# Patient Record
Sex: Male | Born: 1960 | Race: White | Hispanic: No | Marital: Single | Smoking: Current every day smoker
Health system: Southern US, Community
[De-identification: ages and names within clinical notes are randomized; demographics above are authoritative.]

## PROBLEM LIST (undated history)

## (undated) DIAGNOSIS — F209 Schizophrenia, unspecified: Secondary | ICD-10-CM

## (undated) DIAGNOSIS — I452 Bifascicular block: Secondary | ICD-10-CM

## (undated) DIAGNOSIS — I2510 Atherosclerotic heart disease of native coronary artery without angina pectoris: Secondary | ICD-10-CM

## (undated) HISTORY — PX: OTHER SURGICAL HISTORY: SHX169

## (undated) HISTORY — DX: Bifascicular block: I45.2

## (undated) HISTORY — DX: Schizophrenia, unspecified: F20.9

## (undated) HISTORY — DX: Atherosclerotic heart disease of native coronary artery without angina pectoris: I25.10

---

## 2009-06-12 HISTORY — PX: CORONARY ARTERY BYPASS GRAFT: SHX141

## 2009-06-15 ENCOUNTER — Encounter: Payer: Self-pay | Admitting: Cardiology

## 2009-06-16 ENCOUNTER — Ambulatory Visit: Payer: Self-pay | Admitting: Cardiology

## 2009-06-17 ENCOUNTER — Inpatient Hospital Stay (HOSPITAL_COMMUNITY): Admission: EM | Admit: 2009-06-17 | Discharge: 2009-06-26 | Payer: Self-pay | Admitting: Internal Medicine

## 2009-06-17 ENCOUNTER — Ambulatory Visit: Payer: Self-pay | Admitting: Cardiovascular Disease

## 2009-06-17 ENCOUNTER — Encounter: Payer: Self-pay | Admitting: Cardiology

## 2009-06-18 ENCOUNTER — Encounter: Payer: Self-pay | Admitting: Cardiothoracic Surgery

## 2009-06-18 ENCOUNTER — Ambulatory Visit: Payer: Self-pay | Admitting: Cardiothoracic Surgery

## 2009-06-22 ENCOUNTER — Encounter: Payer: Self-pay | Admitting: Cardiothoracic Surgery

## 2009-06-23 ENCOUNTER — Encounter: Payer: Self-pay | Admitting: Cardiothoracic Surgery

## 2009-06-25 ENCOUNTER — Encounter: Payer: Self-pay | Admitting: Cardiothoracic Surgery

## 2009-07-20 ENCOUNTER — Encounter: Admission: RE | Admit: 2009-07-20 | Discharge: 2009-07-20 | Payer: Self-pay | Admitting: Cardiothoracic Surgery

## 2009-07-20 ENCOUNTER — Ambulatory Visit: Payer: Self-pay | Admitting: Cardiothoracic Surgery

## 2009-07-28 ENCOUNTER — Encounter: Payer: Self-pay | Admitting: Cardiology

## 2009-07-29 ENCOUNTER — Ambulatory Visit: Payer: Self-pay | Admitting: Physician Assistant

## 2009-07-29 DIAGNOSIS — I2510 Atherosclerotic heart disease of native coronary artery without angina pectoris: Secondary | ICD-10-CM

## 2009-07-29 DIAGNOSIS — F17200 Nicotine dependence, unspecified, uncomplicated: Secondary | ICD-10-CM | POA: Insufficient documentation

## 2009-07-29 DIAGNOSIS — E782 Mixed hyperlipidemia: Secondary | ICD-10-CM | POA: Insufficient documentation

## 2009-09-23 ENCOUNTER — Encounter (INDEPENDENT_AMBULATORY_CARE_PROVIDER_SITE_OTHER): Payer: Self-pay | Admitting: *Deleted

## 2009-10-30 ENCOUNTER — Encounter (INDEPENDENT_AMBULATORY_CARE_PROVIDER_SITE_OTHER): Payer: Self-pay | Admitting: *Deleted

## 2010-03-02 ENCOUNTER — Ambulatory Visit: Payer: Self-pay | Admitting: Cardiology

## 2010-03-02 ENCOUNTER — Encounter: Payer: Self-pay | Admitting: Cardiology

## 2010-04-13 NOTE — Letter (Signed)
Summary: Generic Engineer, agricultural at [facility] [address]   [place], [place] [address]   Phone: [phone]  Fax: [phone]        October 30, 2009 MRN: [id number]    [facility] [address] [place], [place]  [address]    Dear Mr. KAUTH,   You were asked to have lab work done around July 18th to check your cholesterol and liver function. However, it does not appear this has been done yet.  Please, take the enclosed order to the [facility] at your earliest convenience to have this done. Do not eat or drink after midnight.   If you will not be able to do your lab work at this time, please notify our office so that we can properly document this in  your chart.        Sincerely,  Cyril Loosen, RN, BSN  This letter has been electronically signed by your physician.  Appended Document: Generic Letter Letter will be mailed certified to patient.

## 2010-04-13 NOTE — Cardiovascular Report (Signed)
Summary: [facility]HETER[facility]  [facility] CATHETER[facility]   Imported By: Zachary George 07/28/2009 12:03:54  _____________________________________________________________________  External Attachment:    Type:   Image     Comment:   External Document

## 2010-04-13 NOTE — Progress Notes (Signed)
Summary: TCT OFFICE VISIT (DR.Jackolyn Geron)  TCT OFFICE VISIT (DR.Ksean Vale)   Imported By: Zachary George 07/28/2009 12:01:09  _____________________________________________________________________  External Attachment:    Type:   Image     Comment:   External Document

## 2010-04-13 NOTE — Assessment & Plan Note (Signed)
Summary: eph-post Cat/post CABG   Visit Type:  Follow-up Primary Provider:  none   History of Present Illness: 50 year old male with no prior history of heart disease, status post recent CABG at [facility], now presents for scheduled followup.  He initially presented to [facility] and was referred to Dr. Simona Huh for formal consultation. Serial cardiac markers notable for elevated total CPK, but with normal troponins. 2-D echo indicated preserved LVF, but with apical wall motion abnormality. Exercise stress Cardiolite indicated evidence of anteroseptal ischemia (LAD territory) , with EF 39%. He was subsequently transferred to [facility] for cardiac catheterization.  He was found to have total occlusion of proximal LAD, with R.-L. and L.-L. collaterals; 50% mid CFX stenosis. LV function normal. He underwent two-vessel CABG (off pump), by Dr. Ofilia Neas, with grafting of LIMA-LAD and left RA-OM1.  Patient reports that his initial symptom was that of left arm discomfort, with associated dizziness. He denies ever experiencing any chest discomfort. Since his release, he has not had any recurrent left arm pain. He has been seen in followup by Dr. Tyrone Sage. He is walking 2-3 miles a day, and tolerating his medications. Unfortunately, he continues to smoke, albeit one half dozen cigarettes daily. He has recently been granted total disability.  Current Medications (verified): 1)  Hydrocodone-Acetaminophen 5-500 Mg Tabs (Hydrocodone-Acetaminophen) .... Take 1-2 By Mouth Evry 4-6 Hours As Needed Pain 2)  Ecotrin 325 Mg Tbec (Aspirin) .... Take 1 Tablet By Mouth Once A Day 3)  Metoprolol Tartrate 25 Mg Tabs (Metoprolol Tartrate) .... Take 1 Tablet By Mouth Two Times A Day 4)  Metoprolol Tartrate 25 Mg Tabs (Metoprolol Tartrate) .... Take 1/2 Tablet By Mouth Two Times A Day  Allergies (verified): No Known Drug Allergies  Comments:  Nurse/Medical Assistant: The patient's medications and allergies were  reviewed with the patient and were updated in the Medication and Allergy Lists. Bottles reviewed.  Past History:  Past Medical History: Severe single-vessel CAD...2 vessel CABG (off pump), 4/11 Normal LV function Tobacco Schizophrenia RBBB  Review of Systems       No fevers, chills, hemoptysis, dysphagia, melena, hematocheezia, hematuria, rash, claudication, orthopnea, pnd, pedal edema. All other systems negative.   Vital Signs:  Patient profile:   50 year old male Height:      69 inches Weight:      206 pounds BMI:     30.53 Pulse rate:   96 / minute BP sitting:   112 / 77  (right arm) Cuff size:   large  Vitals Entered By: Carlye Grippe (Jul 29, 2009 1:58 PM)  Nutrition Counseling: Patient's BMI is greater than 25 and therefore counseled on weight management options.  Physical Exam  Additional Exam:  GEN:50 year old male, sitting upright, in no distress HEENT: NCAT,PERRLA,EOMI NECK: palpable pulses, no bruits; no JVD; no TM LUNGS: diminished breath sounds, faint expiratory wheezes HEART: RRR (S1S2); no significant murmurs; no rubs; no gallops ABD: soft, NT; intact BS EXT:no peripheral edema SKIN: warm, dry. Well-healed incision site, with no obvious sign of infection MUSC: no obvious deformity NEURO: A/O (x3)     EKG  Procedure date:  07/29/2009  Findings:      NSR 94 bpm; RBBB; nonspecific ST changes  Impression & Recommendations:  Problem # 1:  CAD (ICD-414.00) patient is doing well, since undergoing recent two-vessel [facility]G at [facility]. He presented here at [facility] with unstable angina, ruled out for MI, and had an abnormal stress Cardiolite suggestive of anteroseptal ischemia. Subsequent coronary angiography  revealed severe single-vessel CAD, with 100% chronically occluded proximal LAD with distal collateralization, moderate CFX stenosis, and normal LV function. Patient is on good medication regimen, including beta blocker and a statin. We will down  titrate aspirin to 81 mg daily, and convert current b.i.d. metoprolol dosing to Toprol 25 mg daily, to ensure compliance. Will refer patient to Dr. Donzetta Sprung to establish with a primary care physician. We'll schedule a return clinic visit with myself and Dr. Diona Browner in 3 months.  Problem # 2:  DYSLIPIDEMIA (ICD-272.4) Will reassess lipid status with a repeat study in 2 months. Aggressive management recommended, with target LDL of 70 her loss, if feasible.  Problem # 3:  TOBACCO ABUSE (ICD-305.1) patient was counseled regarding the importance of smoking cessation. He will try to do so on his own.  Other Orders: EKG w/ Interpretation (93000) Future Orders: T-Lipid Profile ([id number]) ... 09/28/2009  Patient Instructions: 1)  Establish with Dr. Donzetta Sprung at [facility]. You saw this provider when you were in the hospital. His office number is [phone]. 2)  Decrease Aspirin to 162mg  (1/2 of 325mg  tablet) once daily. Then decrease to 81mg  Aspirin once daily.  3)  Your physician discussed the hazards of tobacco use.  Tobacco use cessation is recommended and techniques and options to help you quit were discussed. 4)  Finish your current supply of Lopressor (metoprolol tartrate) and then start Toprol XL (metoprolol succinate or ER) 25mg  by mouth once daily. 5)  Your physician recommends that you go to the [facility] for a FASTING lipid profile and liver function labs: DO LAB WORK IN 2 MONTHS. Prescriptions: METOPROLOL SUCCINATE 25 MG XR24H-TAB (METOPROLOL SUCCINATE) Take one tablet by mouth daily  #30 x 6   Entered by:   Cyril Loosen, RN, BSN   Authorized by:   Nelida Meuse, PA-C   Signed by:   Cyril Loosen, RN, BSN on 07/29/2009   Method used:   Electronically to        [facility] (retail)       [address]       [place], [place]  [address]       Ph: [phone]       Fax: [phone]   RxID:   [id number]

## 2010-04-13 NOTE — Letter (Signed)
Summary: [facility] D/C DR. Orvan Falconer  [facility] D/C DR. Orvan Falconer   Imported By: Zachary George 07/28/2009 12:13:36  _____________________________________________________________________  External Attachment:    Type:   Image     Comment:   External Document

## 2010-04-13 NOTE — Consult Note (Signed)
Summary: CONSULTATION (DR. Tyrone Sage)  CONSULTATION (DR. Tyrone Sage)   Imported By: Zachary George 07/28/2009 12:09:35  _____________________________________________________________________  External Attachment:    Type:   Image     Comment:   External Document

## 2010-04-13 NOTE — Consult Note (Signed)
Summary: CARDIOLOGY CONSULT/ [facility]  CARDIOLOGY CONSULT/ [facility]   Imported By: Zachary George 07/28/2009 11:57:21  _____________________________________________________________________  External Attachment:    Type:   Image     Comment:   External Document

## 2010-04-13 NOTE — Letter (Signed)
Summary: Generic Engineer, agricultural at [facility] [address]   [place], [place] [address]   Phone: [phone]  Fax: [phone]        September 23, 2009 MRN: [id number]    [facility] [place] [address] [place], [place]  [address]    Dear Mr. BRAZEL,    Bonita Quin are due for lab work to check your cholesterol and liver function following your May 18th office visit. Please take the enclosed order to the [facility] at your earliest convenience to have this done. Do not eat or drink after midnight.      Sincerely,  Cyril Loosen, RN, BSN  This letter has been electronically signed by your physician.  Appended Document: Orders Update    Clinical Lists Changes  Orders: Added new Test order of T-Hepatic Function [id number]) - Signed      Appended Document: Generic Letter Pt has not completed lab work yet. Left message to remind pt to have labs done.

## 2010-04-15 NOTE — Assessment & Plan Note (Signed)
Summary: 3 mo ful   Visit Type:  Follow-up Primary Provider:  Dr. Donzetta Sprung   History of Present Illness: 50 year old male presents for follow-up. He was seen by Mr. Serpe back in May. Follow-up labs were requested at that time however the patient did not have this done. He states today that he has been off all his medications for the last several months. Has had no follow up with Dr. Reuel Boom.  He states he's been exercising using weights, 10-12 pounds, doing mainly arm exercises. Has not been walking regularly. I spoke with him about increasing his walking regimen.  He reports no anginal chest pain. Occasional cough and chest congestion. States he is cutting his cigarette use back, but has not been able to quit. We talked about this today.  I spoke with him about the importance of regular followup, establishing with his primary care provider, and also taking his medicines regularly. We discussed reinstituting aspirin and low-dose beta blocker, with plan for followup blood work and then better considering dose of statin therapy.    Preventive Screening-Counseling & Management  Alcohol-Tobacco     Smoking Status: current     Smoking Cessation Counseling: yes     Packs/Day: 1/2 PPD      Drug Use:  no.    Current Medications (verified): 1)  Ecotrin 325 Mg Tbec (Aspirin) .... Take 1/2 Tablet By Mouth Once A Day 2)  Metoprolol Succinate 25 Mg Xr24h-Tab (Metoprolol Succinate) .... Take One Tablet By Mouth Daily  Allergies (verified): No Known Drug Allergies  Comments:  Nurse/Medical Assistant: Patient said he hasn't taken any of his meds in over 4 months. The patient is currently on medications but does not know the name or dosage at this time. Instructed to contact our office with details. Will update medication list at that time.  Past History:  Social History: Last updated: 03/02/2010 Lives alone Self employed Alcohol Use - yes, 2-3 beers a day Drug Use - yes Tobacco  Use - Yes.   Past Medical History: CAD - 2 vessel Normal LV function Tobacco Schizophrenia RBBB  Past Surgical History: Left pneumothorax secondary to trauma CABG - 4/11, off pump LIMA to LAD and radial to OM  Social History: Lives alone Self employed Alcohol Use - yes, 2-3 beers a day Drug Use - yes Tobacco Use - Yes.  Drug Use:  no Smoking Status:  current Packs/Day:  1/2 PPD  Review of Systems       The patient complains of weight gain.  The patient denies anorexia, fever, chest pain, syncope, dyspnea on exertion, peripheral edema, hemoptysis, melena, and hematochezia.         Otherwise reviewed and negative except as outlined.  Vital Signs:  Patient profile:   50 year old male Height:      69 inches Weight:      202 pounds Pulse rate:   90 / minute BP sitting:   114 / 78  (left arm) Cuff size:   large  Vitals Entered By: Carlye Grippe (March 02, 2010 1:33 PM)  Physical Exam  Additional Exam:  Overweight male in no acute distress. HEENT: Conjunctiva and lids normal, oropharynx with poor dentition. Neck: Supple, no elevated JVP or carotid bruits. Lungs: Diminished breath sounds throughout, rhonchi, no wheezes. Cardiac: Regular rate and rhythm, no S3 or pericardial rub. Abdomen: Nontender, bowel sounds present. Skin: Warm and dry. Musculoskeletal: No kyphosis. Extremities: No pitting edema. Neuropsychiatric: Alert and oriented x3, affect grossly appropriate.  EKG  Procedure date:  03/02/2010  Findings:      Sinus rhythm at 91 beats per minute, lead motion artifact.  Impression & Recommendations:  Problem # 1:  CAD (ICD-414.00)  Plan to reinstitute aspirin and low-dose beta blocker. Encouraged a more regular walking regimen. Followup scheduled for 3 months.  His updated medication list for this problem includes:    Ecotrin 325 Mg Tbec (Aspirin) .Marland Kitchen... Take 1/2 tablet by mouth once a day    Metoprolol Succinate 25 Mg Xr24h-tab (Metoprolol  succinate) .Marland Kitchen... Take 1/2 tablet by mouth daily  Orders: EKG w/ Interpretation (93[address])  Problem # 2:  TOBACCO ABUSE (ICD-305.1)  We discussed smoking cessation strategies again today.  Problem # 3:  DYSLIPIDEMIA (ICD-272.4)  Has not been on statin therapy for several months. Plan followup fasting lipid profile and liver function tests, and then we can proceed from there.  Patient Instructions: 1)  Your physician wants you to follow-up in: 3 months. You will receive a reminder letter in the mail one-two months in advance. If you don't receive a letter, please call our office to schedule the follow-up appointment. 2)  Your physician recommends that you go to the [facility] for a FASTING lipid profile and liver function labs:  Do not eat or drink after midnight. 3)  Take Toprol (metoprolol XL) 25mg  1/2 tablet by mouth once daily.  4)  See Dr. Reuel Boom. You will need to contact his office to set this up. Prescriptions: METOPROLOL SUCCINATE 25 MG XR24H-TAB (METOPROLOL SUCCINATE) Take 1/2 tablet by mouth daily  #15 x 6   Entered by:   Cyril Loosen, RN, BSN   Authorized by:   Loreli Slot, MD, [facility]   Signed by:   Cyril Loosen, RN, BSN on 03/02/2010   Method used:   Electronically to        [facility] [address] (retail)       [address]       [place], [place]  [address]       Ph: [phone] or [phone]       Fax: [phone]   RxID:   [id number]

## 2010-04-26 ENCOUNTER — Encounter (INDEPENDENT_AMBULATORY_CARE_PROVIDER_SITE_OTHER): Payer: Self-pay | Admitting: *Deleted

## 2010-05-05 NOTE — Letter (Addendum)
Summary: Certified Engineer, agricultural at [facility]. [address]   [place], [place] [address]   Phone: [phone]  Fax: [phone]        April 26, 2010 MRN: [id number]    [facility] [place] [address] [place], [place]  [address]    Dear Mr. CLEARMAN,   You were asked to have lab work done following your December 20th office visit. However, it does not appear this has been done yet.  Please, take the enclosed order to the [facility] at your earliest convenience to have this done. Do not eat or drink after midnight.   If you will not be able to do your lab work at this time, please notify our office so that we can properly document this in  your chart.        Sincerely,  Cyril Loosen, RN, BSN  This letter has been electronically signed by your physician.  Appended Document: Certified Letter Pt walked into office stating he hasn't been on cholesterol meds. Notified pt per Dr. Diona Browner, he was to have cholesterol/liver labs done to make further decision regarding meds. Pt verbalized understanding. Pt will have labs done.   Also, pt mentions he never started metoprolol. He will pick this rx up at [facility].

## 2010-05-07 ENCOUNTER — Encounter: Payer: Self-pay | Admitting: Cardiology

## 2010-05-20 NOTE — Letter (Signed)
Summary: Certified Letter Response Card   Certified Letter Response Card   Imported By: Cyril Loosen, RN, BSN 05/13/2010 11:45:16  _____________________________________________________________________  External Attachment:    Type:   Image     Comment:   External Document

## 2010-06-02 LAB — GLUCOSE, CAPILLARY
Glucose-Capillary: 105 mg/dL — ABNORMAL HIGH (ref 70–99)
Glucose-Capillary: 120 mg/dL — ABNORMAL HIGH (ref 70–99)

## 2010-06-02 LAB — CBC
HCT: 33.5 % — ABNORMAL LOW (ref 39.0–52.0)
HCT: 34.5 % — ABNORMAL LOW (ref 39.0–52.0)
HCT: 35 % — ABNORMAL LOW (ref 39.0–52.0)
HCT: 37.3 % — ABNORMAL LOW (ref 39.0–52.0)
HCT: 37.4 % — ABNORMAL LOW (ref 39.0–52.0)
HCT: 39.9 % (ref 39.0–52.0)
HCT: 44.8 % (ref 39.0–52.0)
Hemoglobin: 11.6 g/dL — ABNORMAL LOW (ref 13.0–17.0)
Hemoglobin: 11.7 g/dL — ABNORMAL LOW (ref 13.0–17.0)
Hemoglobin: 12.2 g/dL — ABNORMAL LOW (ref 13.0–17.0)
Hemoglobin: 12.6 g/dL — ABNORMAL LOW (ref 13.0–17.0)
Hemoglobin: 13 g/dL (ref 13.0–17.0)
Hemoglobin: 13.4 g/dL (ref 13.0–17.0)
Hemoglobin: 15.2 g/dL (ref 13.0–17.0)
Hemoglobin: 15.8 g/dL (ref 13.0–17.0)
MCHC: 33.6 g/dL (ref 30.0–36.0)
MCHC: 33.6 g/dL (ref 30.0–36.0)
MCHC: 33.9 g/dL (ref 30.0–36.0)
MCHC: 34.8 g/dL (ref 30.0–36.0)
MCHC: 34.8 g/dL (ref 30.0–36.0)
MCHC: 34.9 g/dL (ref 30.0–36.0)
MCV: 93.8 fL (ref 78.0–100.0)
MCV: 94 fL (ref 78.0–100.0)
MCV: 94.7 fL (ref 78.0–100.0)
MCV: 94.7 fL (ref 78.0–100.0)
MCV: 94.7 fL (ref 78.0–100.0)
MCV: 95.2 fL (ref 78.0–100.0)
MCV: 95.7 fL (ref 78.0–100.0)
Platelets: 112 10*3/uL — ABNORMAL LOW (ref 150–400)
Platelets: 119 10*3/uL — ABNORMAL LOW (ref 150–400)
Platelets: 124 10*3/uL — ABNORMAL LOW (ref 150–400)
Platelets: 130 10*3/uL — ABNORMAL LOW (ref 150–400)
Platelets: 141 10*3/uL — ABNORMAL LOW (ref 150–400)
Platelets: 149 10*3/uL — ABNORMAL LOW (ref 150–400)
RBC: 3.53 MIL/uL — ABNORMAL LOW (ref 4.22–5.81)
RBC: 3.64 MIL/uL — ABNORMAL LOW (ref 4.22–5.81)
RBC: 3.66 MIL/uL — ABNORMAL LOW (ref 4.22–5.81)
RBC: 3.95 MIL/uL — ABNORMAL LOW (ref 4.22–5.81)
RBC: 3.97 MIL/uL — ABNORMAL LOW (ref 4.22–5.81)
RBC: 4.19 MIL/uL — ABNORMAL LOW (ref 4.22–5.81)
RBC: 4.77 MIL/uL (ref 4.22–5.81)
RDW: 12 % (ref 11.5–15.5)
RDW: 12.1 % (ref 11.5–15.5)
RDW: 12.1 % (ref 11.5–15.5)
RDW: 12.1 % (ref 11.5–15.5)
RDW: 12.2 % (ref 11.5–15.5)
RDW: 12.3 % (ref 11.5–15.5)
RDW: 12.4 % (ref 11.5–15.5)
WBC: 10.5 10*3/uL (ref 4.0–10.5)
WBC: 10.5 10*3/uL (ref 4.0–10.5)
WBC: 10.9 10*3/uL — ABNORMAL HIGH (ref 4.0–10.5)
WBC: 11.2 10*3/uL — ABNORMAL HIGH (ref 4.0–10.5)
WBC: 11.3 10*3/uL — ABNORMAL HIGH (ref 4.0–10.5)
WBC: 6 10*3/uL (ref 4.0–10.5)
WBC: 6.6 10*3/uL (ref 4.0–10.5)
WBC: 9.9 10*3/uL (ref 4.0–10.5)

## 2010-06-02 LAB — BLOOD GAS, ARTERIAL
Acid-Base Excess: 1.6 mmol/L (ref 0.0–2.0)
Bicarbonate: 25.9 mEq/L — ABNORMAL HIGH (ref 20.0–24.0)
Drawn by: 317771
FIO2: 0.21 %
O2 Saturation: 96.2 %
Patient temperature: 98.6
TCO2: 27.2 mmol/L (ref 0–100)
pCO2 arterial: 42.7 mmHg (ref 35.0–45.0)
pH, Arterial: 7.4 (ref 7.350–7.450)
pO2, Arterial: 80.8 mmHg (ref 80.0–100.0)

## 2010-06-02 LAB — BASIC METABOLIC PANEL
BUN: 11 mg/dL (ref 6–23)
BUN: 7 mg/dL (ref 6–23)
BUN: 8 mg/dL (ref 6–23)
BUN: 9 mg/dL (ref 6–23)
CO2: 25 mEq/L (ref 19–32)
CO2: 26 mEq/L (ref 19–32)
CO2: 28 mEq/L (ref 19–32)
CO2: 30 mEq/L (ref 19–32)
CO2: 32 mEq/L (ref 19–32)
Calcium: 8.1 mg/dL — ABNORMAL LOW (ref 8.4–10.5)
Calcium: 8.1 mg/dL — ABNORMAL LOW (ref 8.4–10.5)
Calcium: 8.4 mg/dL (ref 8.4–10.5)
Chloride: 102 mEq/L (ref 96–112)
Chloride: 105 mEq/L (ref 96–112)
Chloride: 98 mEq/L (ref 96–112)
Chloride: 98 mEq/L (ref 96–112)
Creatinine, Ser: 0.94 mg/dL (ref 0.4–1.5)
Creatinine, Ser: 1 mg/dL (ref 0.4–1.5)
Creatinine, Ser: 1 mg/dL (ref 0.4–1.5)
Creatinine, Ser: 1.04 mg/dL (ref 0.4–1.5)
GFR calc Af Amer: >60 mL/min (ref >60)
GFR calc Af Amer: >60 mL/min (ref >60)
GFR calc Af Amer: >60 mL/min (ref >60)
GFR calc Af Amer: >60 mL/min (ref >60)
GFR calc non Af Amer: >60 mL/min (ref >60)
GFR calc non Af Amer: >60 mL/min (ref >60)
GFR calc non Af Amer: >60 mL/min (ref >60)
Glucose, Bld: 117 mg/dL — ABNORMAL HIGH (ref 70–99)
Glucose, Bld: 125 mg/dL — ABNORMAL HIGH (ref 70–99)
Glucose, Bld: 151 mg/dL — ABNORMAL HIGH (ref 70–99)
Glucose, Bld: 92 mg/dL (ref 70–99)
Glucose, Bld: 96 mg/dL (ref 70–99)
Potassium: 3.7 mEq/L (ref 3.5–5.1)
Potassium: 3.7 mEq/L (ref 3.5–5.1)
Potassium: 4.1 mEq/L (ref 3.5–5.1)
Potassium: 4.2 mEq/L (ref 3.5–5.1)
Potassium: 4.4 mEq/L (ref 3.5–5.1)
Sodium: 134 mEq/L — ABNORMAL LOW (ref 135–145)
Sodium: 134 mEq/L — ABNORMAL LOW (ref 135–145)
Sodium: 135 mEq/L (ref 135–145)

## 2010-06-02 LAB — POCT I-STAT, CHEM 8
BUN: 8 mg/dL (ref 6–23)
Creatinine, Ser: 1 mg/dL (ref 0.4–1.5)
HCT: 39 % (ref 39.0–52.0)
Hemoglobin: 13.3 g/dL (ref 13.0–17.0)
Sodium: 137 mEq/L (ref 135–145)
Sodium: 138 mEq/L (ref 135–145)
TCO2: 24 mmol/L (ref 0–100)
TCO2: 30 mmol/L (ref 0–100)

## 2010-06-02 LAB — POCT I-STAT 3, ART BLOOD GAS (G3+)
Acid-base deficit: 1 mmol/L (ref 0.0–2.0)
Acid-base deficit: 2 mmol/L (ref 0.0–2.0)
Bicarbonate: 25.4 mEq/L — ABNORMAL HIGH (ref 20.0–24.0)
O2 Saturation: 99 %
O2 Saturation: 99 %
TCO2: 26 mmol/L (ref 0–100)
TCO2: 27 mmol/L (ref 0–100)
pCO2 arterial: 43.7 mmHg (ref 35.0–45.0)
pCO2 arterial: 46.3 mmHg — ABNORMAL HIGH (ref 35.0–45.0)
pO2, Arterial: 145 mmHg — ABNORMAL HIGH (ref 80.0–100.0)

## 2010-06-02 LAB — CK TOTAL AND CKMB (NOT AT ARMC)
CK, MB: 8.1 ng/mL (ref 0.3–4.0)
Relative Index: 1.2 (ref 0.0–2.5)
Total CK: 682 U/L — ABNORMAL HIGH (ref 7–232)

## 2010-06-02 LAB — TYPE AND SCREEN
ABO/RH(D): O POS
Antibody Screen: NEGATIVE

## 2010-06-02 LAB — POCT I-STAT 4, (NA,K, GLUC, HGB,HCT)
Glucose, Bld: 102 mg/dL — ABNORMAL HIGH (ref 70–99)
Glucose, Bld: 111 mg/dL — ABNORMAL HIGH (ref 70–99)
Glucose, Bld: 113 mg/dL — ABNORMAL HIGH (ref 70–99)
HCT: 40 % (ref 39.0–52.0)
HCT: 40 % (ref 39.0–52.0)
HCT: 40 % (ref 39.0–52.0)
Hemoglobin: 13.6 g/dL (ref 13.0–17.0)
Hemoglobin: 13.6 g/dL (ref 13.0–17.0)
Hemoglobin: 13.6 g/dL (ref 13.0–17.0)
Potassium: 4.1 mEq/L (ref 3.5–5.1)
Potassium: 4.4 mEq/L (ref 3.5–5.1)
Sodium: 136 mEq/L (ref 135–145)
Sodium: 136 mEq/L (ref 135–145)
Sodium: 138 mEq/L (ref 135–145)

## 2010-06-02 LAB — CREATININE, SERUM
Creatinine, Ser: 0.9 mg/dL (ref 0.4–1.5)
Creatinine, Ser: 0.94 mg/dL (ref 0.4–1.5)
GFR calc Af Amer: >60 mL/min (ref >60)
GFR calc Af Amer: >60 mL/min (ref >60)
GFR calc non Af Amer: >60 mL/min (ref >60)
GFR calc non Af Amer: >60 mL/min (ref >60)

## 2010-06-02 LAB — URINALYSIS, ROUTINE W REFLEX MICROSCOPIC
Bilirubin Urine: NEGATIVE
Glucose, UA: NEGATIVE mg/dL
Hgb urine dipstick: NEGATIVE
Protein, ur: NEGATIVE mg/dL
Urobilinogen, UA: 1 mg/dL (ref 0.0–1.0)

## 2010-06-02 LAB — APTT
aPTT: 27 seconds (ref 24–37)
aPTT: 29 seconds (ref 24–37)

## 2010-06-02 LAB — PLATELET FUNCTION ASSAY
Collagen / ADP: 63 seconds (ref 0–114)
Collagen / ADP: 82 seconds (ref 0–114)
Collagen / Epinephrine: >300 seconds (ref 0–184)

## 2010-06-02 LAB — PROTIME-INR
INR: 1.05 (ref 0.00–1.49)
Prothrombin Time: 13.6 seconds (ref 11.6–15.2)

## 2010-06-02 LAB — COMPREHENSIVE METABOLIC PANEL
ALT: 96 U/L — ABNORMAL HIGH (ref 0–53)
AST: 60 U/L — ABNORMAL HIGH (ref 0–37)
Albumin: 3.9 g/dL (ref 3.5–5.2)
Alkaline Phosphatase: 57 U/L (ref 39–117)
BUN: 14 mg/dL (ref 6–23)
CO2: 26 mEq/L (ref 19–32)
Calcium: 9.3 mg/dL (ref 8.4–10.5)
Chloride: 104 mEq/L (ref 96–112)
Creatinine, Ser: 1.08 mg/dL (ref 0.4–1.5)
GFR calc Af Amer: >60 mL/min (ref >60)
GFR calc non Af Amer: >60 mL/min (ref >60)
Glucose, Bld: 120 mg/dL — ABNORMAL HIGH (ref 70–99)
Potassium: 4.2 mEq/L (ref 3.5–5.1)
Sodium: 137 mEq/L (ref 135–145)
Total Bilirubin: 0.4 mg/dL (ref 0.3–1.2)
Total Protein: 7 g/dL (ref 6.0–8.3)

## 2010-06-02 LAB — URINE MICROSCOPIC-ADD ON

## 2010-06-02 LAB — MAGNESIUM
Magnesium: 1.9 mg/dL (ref 1.5–2.5)
Magnesium: 1.9 mg/dL (ref 1.5–2.5)
Magnesium: 2.3 mg/dL (ref 1.5–2.5)

## 2010-06-02 LAB — PLATELET INHIBITION P2Y12
P2Y12 % Inhibition: 25 %
P2Y12 % Inhibition: 39 %
Platelet Function  P2Y12: 176 [PRU] — ABNORMAL LOW (ref 194–418)
Platelet Function Baseline: 263 [PRU] (ref 194–418)
Platelet Function Baseline: 287 [PRU] (ref 194–418)

## 2010-06-02 LAB — ABO/RH: ABO/RH(D): O POS

## 2010-06-02 LAB — CULTURE, ROUTINE-ABSCESS

## 2010-06-02 LAB — CARDIAC PANEL(CRET KIN+CKTOT+MB+TROPI)
Total CK: 229 U/L (ref 7–232)
Troponin I: 0.01 ng/mL (ref 0.00–0.06)

## 2010-06-02 LAB — HEPARIN LEVEL (UNFRACTIONATED): Heparin Unfractionated: 0.32 IU/mL (ref 0.30–0.70)

## 2010-06-16 ENCOUNTER — Encounter: Payer: Self-pay | Admitting: Cardiology

## 2010-06-17 ENCOUNTER — Ambulatory Visit: Payer: Self-pay | Admitting: Cardiology

## 2010-07-27 NOTE — Assessment & Plan Note (Signed)
OFFICE VISIT   Cameron Stone, Cameron Stone  DOB:  June 06, 1960                                        Jul 20, 2009  CHART #:  [id number]   REASON FOR OFFICE VISIT:  Routine followup status post CABG x2.   HISTORY OF PRESENT ILLNESS:  This is a 50 year old Caucasian male who is  status post CABG x2 (off-pump) by Dr. Tyrone Sage on June 22, 2009.  The  patient has been doing fairly well since having been discharged from the  hospital.  His only complaint is some tingling near the thumb area of  the left forearm (the patient had left radial artery open harvest).  The  patient denies any chest pain, shortness of breath, fever, or chills.  His only other complaint at this time is occasional cough, for which he  is taking guaifenesin.   PHYSICAL EXAMINATION:  General:  This is a pleasant 50 year old  Caucasian male who is in no acute distress who is alert, oriented, and  cooperative.  Vital Signs:  As follows; BP 125/83, respiration rate 18,  and O2 sat 96% on room air.  Cardiovascular:  Regular rate and rhythm.  No murmurs, gallops, or rubs.  Pulmonary:  Slightly diminished at the  left base, otherwise clear.  No rales, wheezes, or rhonchi.  Abdomen:  Soft and nontender.  Bowel sounds present.  Chest:  Sternal wound is  clean, dry, and well healed.  No signs of sternal instability.  Extremities:  Left forearm wound is clean and dry.  No signs of  infection.   DIAGNOSTIC TESTS:  Chest x-ray done today shows resolution of previous  right pleural effusion and small left pleural effusion with some  atelectasis.  No pneumothorax.   IMPRESSION AND PLAN:  1. Overall, the patient is surgically stable status post coronary      artery bypass graft, off-pump x2.  He unfortunately has not made an      appointment to see Dr. Andee Lineman yet.  However, our office has made      him an appointment to see him on Jul 29, 2009 at 1 p.m.  The      patient was instructed on the importance of keeping  this      appointment.  2. The patient instructed he may begin driving short distances during      the day.  He may gradually increase his frequency and duration over      the next couple of weeks.  3. The patient was encouraged to continue with sternal precautions for      at least 3-4 more weeks (i.e. no lifting more than 10 pounds).  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  4. After talking further with the patient, he then stated he did have      some incisional pain that mostly occurred at night and that he no      longer has any pain medication.  I have given a prescription for      Lortab 5/500.  He is to take 1 or 2 every 4-6 hours as needed for      pain.  He will  return to see Dr. Tyrone Sage on a p.r.n. basis.   Sheliah Plane, MD  Electronically Signed   DZ/MEDQ  D:  07/20/2009  T:  07/21/2009  Job:  [id number]   cc:   Learta Codding, MD,FACC

## 2011-04-04 IMAGING — CR DG CHEST 2V
2 series · 2 of 2 positions shown · non-contrast
Comparison: None.

CLINICAL DATA: Hypertension, shortness of breath, preop

CHEST - 2 VIEW

[w chest pa]
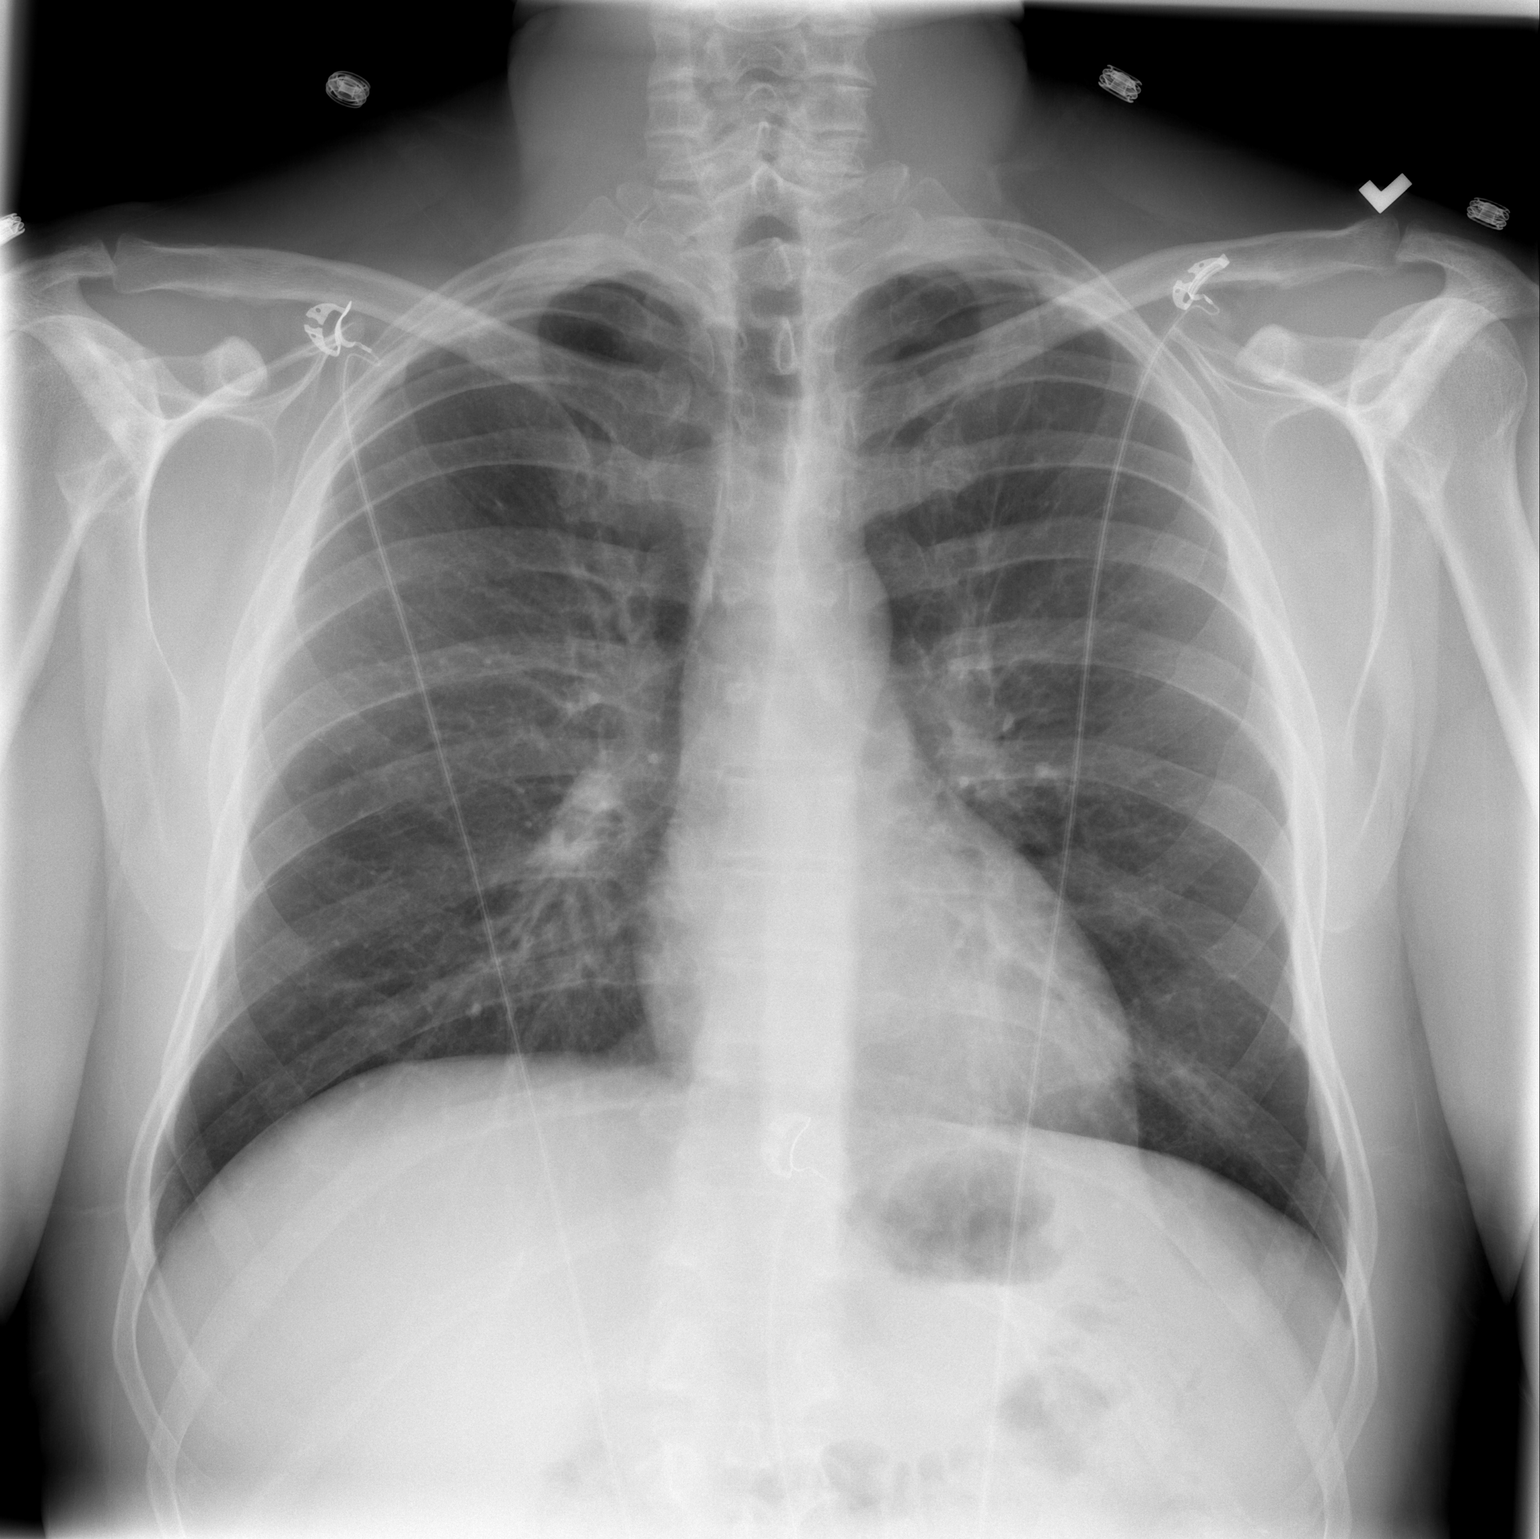

[w chest lat]
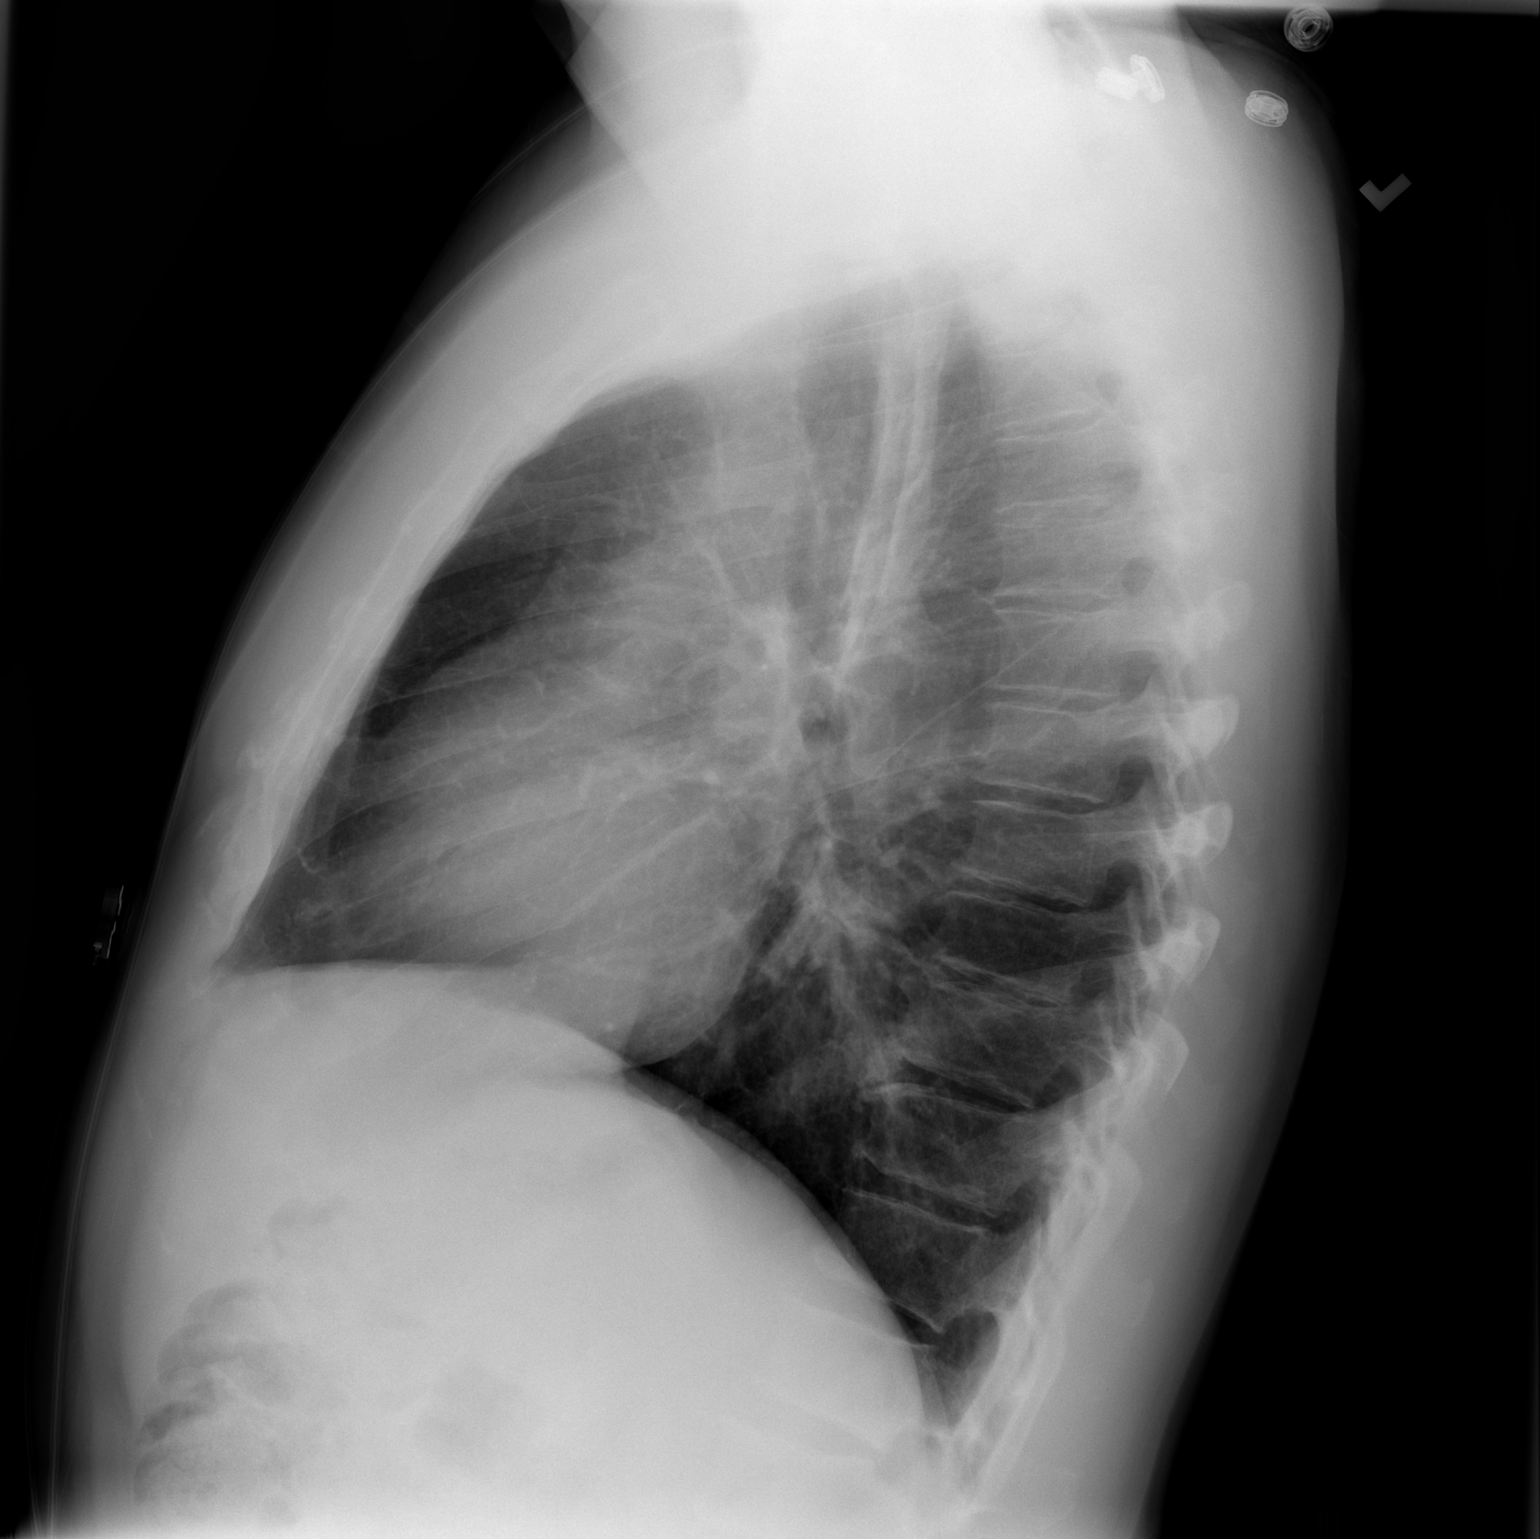

[2 of 2 positions shown; findings below may reference images not displayed]

FINDINGS: Degenerative spurring in the lower thoracic spine. Lungs
clear.  Heart size and pulmonary vascularity normal.  No effusion.
IMPRESSION: No acute disease

## 2011-04-05 IMAGING — CR DG CHEST 1V PORT
1 series · 1 of 1 positions shown · non-contrast
Comparison: [DATE]

CLINICAL DATA: Follow-up CABG

PORTABLE CHEST - 1 VIEW

[AP]
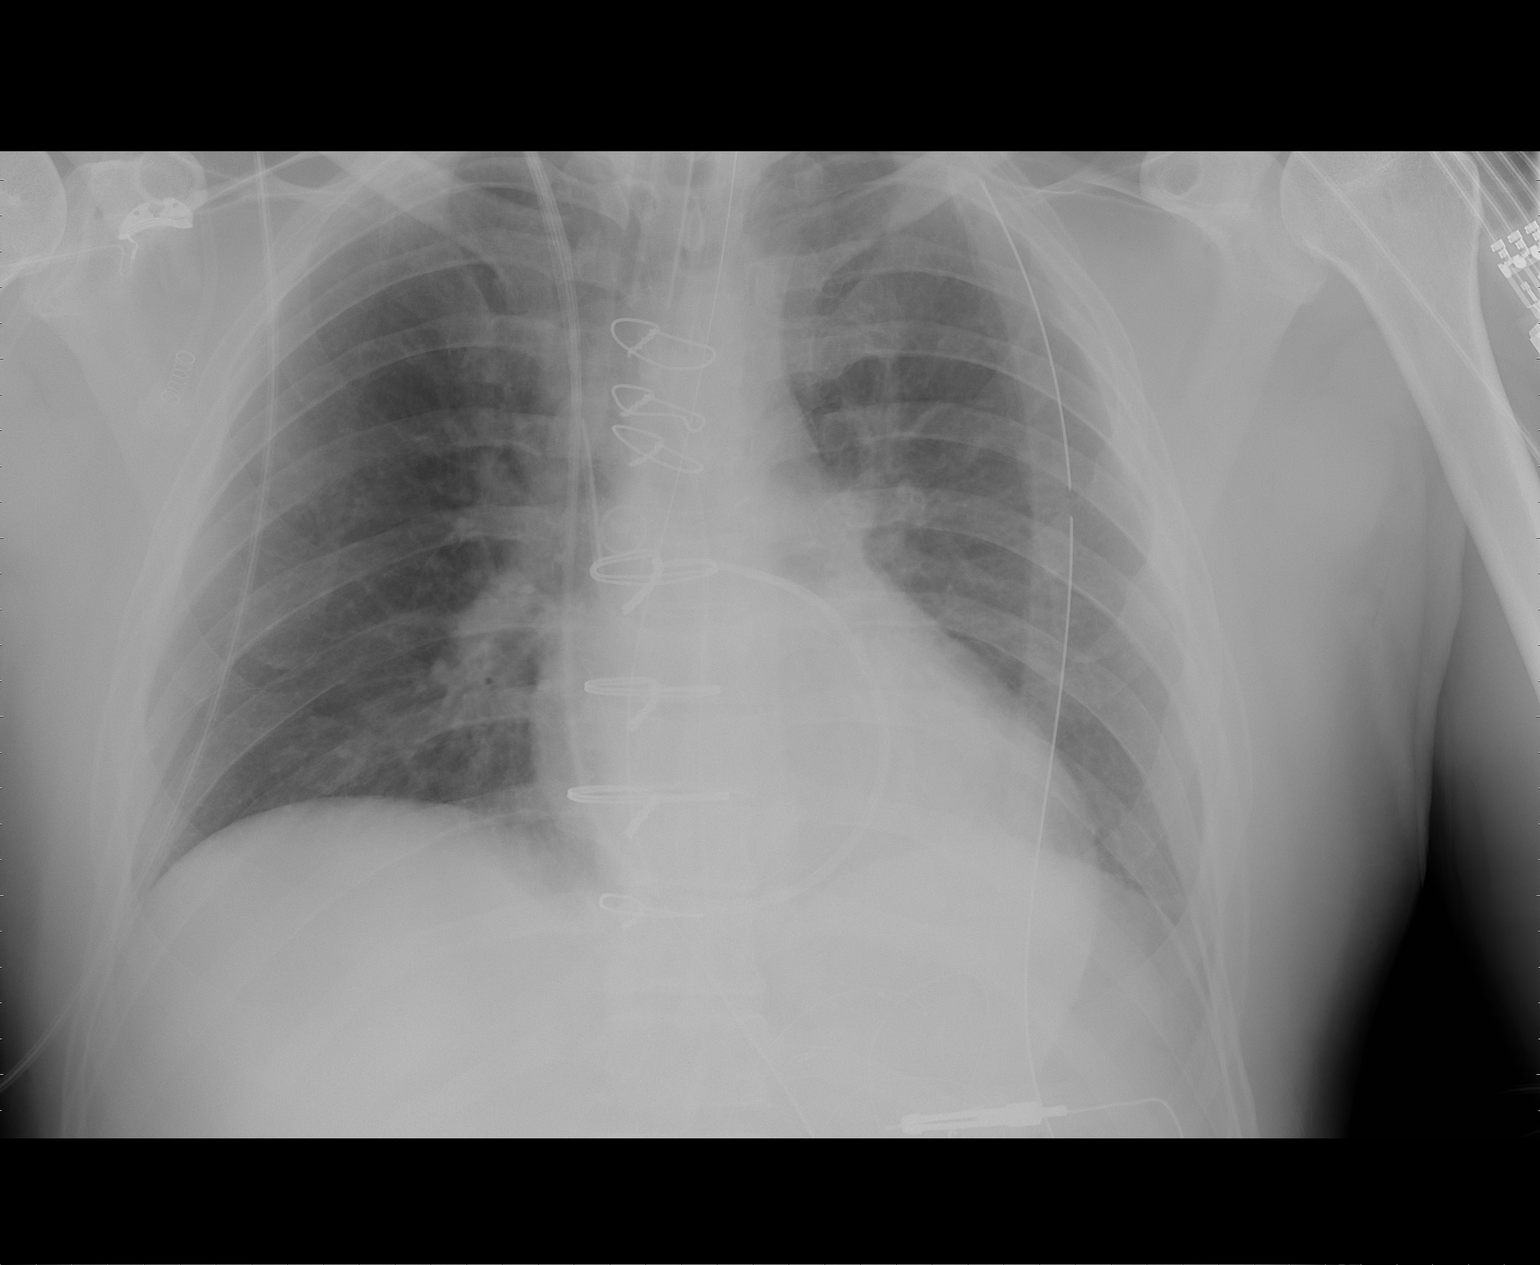

[1 of 1 positions shown; findings below may reference images not displayed]

FINDINGS: There is been median sternotomy and CABG.  Endotracheal
tube has its tip 4 cm above the carina.  Nasogastric tube enters
the stomach.  Swan-Ganz catheter has its tip in the main pulmonary
artery.  Right internal jugular central line has its tip in the SVC
2 cm above the right atrium.  Left chest tube is in place.  There
may be a very tiny amount of pleural air at the apex.  There is
minimal left base atelectasis.
IMPRESSION: Lines and tubes well positioned.  A tiny amount of air at the
pleural apex on the left.  Mild left lower lobe atelectasis.

## 2011-04-06 IMAGING — CR DG CHEST 1V PORT
1 series · 1 of 1 positions shown · non-contrast
Comparison: 06/22/2009.

CLINICAL DATA: Unstable angina.  CABG.

PORTABLE CHEST - 1 VIEW

[view not recorded]
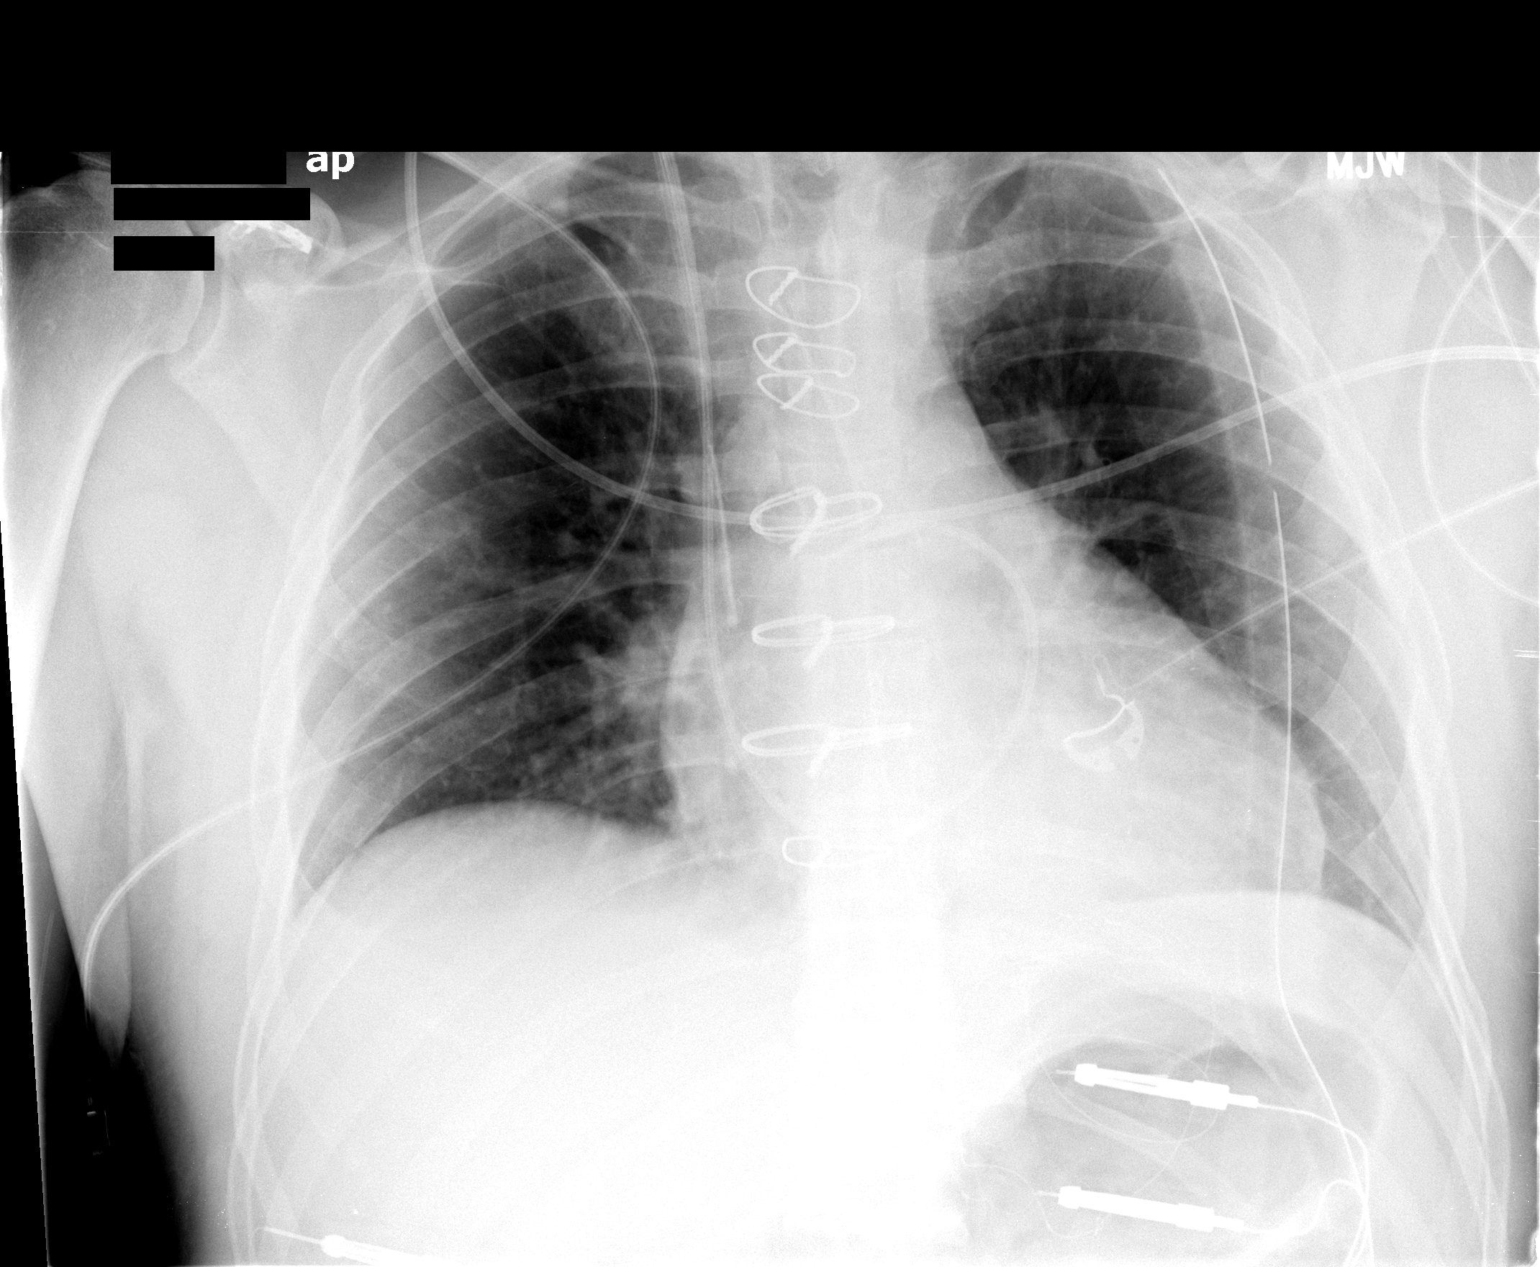

[1 of 1 positions shown; findings below may reference images not displayed]

FINDINGS: Lungs well expanded and clear.  No pneumothorax.  Left
pleural chest tube position.  ET and NG tubes have been removed.
SGC tip is in the right main pulmonary artery.  Central venous
catheter is near the SVC - right atrial junction.
IMPRESSION: Satisfactory postoperative chest findings.

## 2011-04-07 IMAGING — CR DG CHEST 1V PORT
1 series · 1 of 1 positions shown · non-contrast
Comparison: Portable chest x-ray of 06/23/2009

CLINICAL DATA: CABG, follow-up

PORTABLE CHEST - 1 VIEW

[view not recorded]
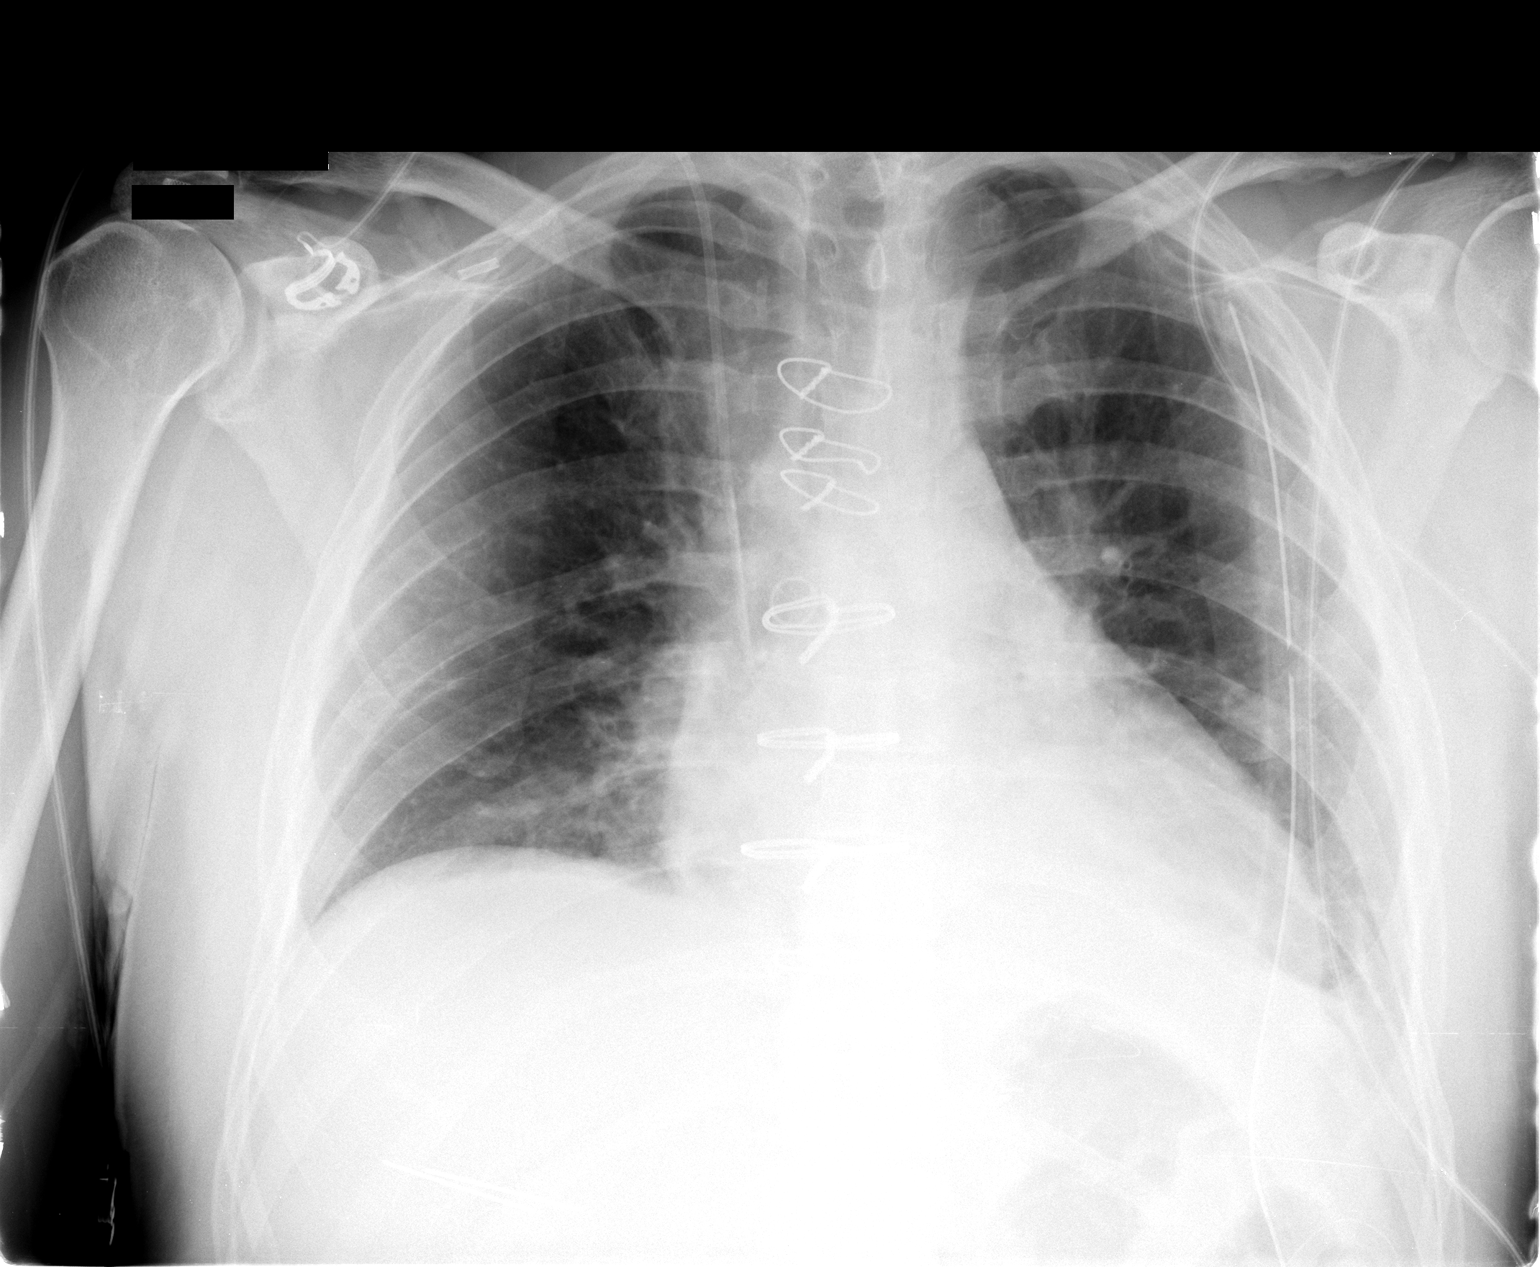

[1 of 1 positions shown; findings below may reference images not displayed]

FINDINGS: The Swan-Ganz catheter has been removed.  Left chest tube
and right IJ central venous catheter remain.  A tiny left apical
pneumothorax is present.  Cardiomegaly is stable.
IMPRESSION: Swan-Ganz catheter removed.  Tiny left apical pneumothorax with
left chest tube remaining.

## 2019-11-09 ENCOUNTER — Other Ambulatory Visit: Payer: Self-pay

## 2019-11-09 ENCOUNTER — Ambulatory Visit
Admission: EM | Admit: 2019-11-09 | Discharge: 2019-11-09 | Disposition: A | Discharge status: Home / Self Care | Payer: Medicare Other | Attending: Emergency Medicine | Admitting: Emergency Medicine

## 2019-11-09 ENCOUNTER — Encounter: Payer: Self-pay | Admitting: Emergency Medicine

## 2019-11-09 DIAGNOSIS — K047 Periapical abscess without sinus: Secondary | ICD-10-CM | POA: Diagnosis not present

## 2019-11-09 DIAGNOSIS — R220 Localized swelling, mass and lump, head: Secondary | ICD-10-CM

## 2019-11-09 DIAGNOSIS — K0889 Other specified disorders of teeth and supporting structures: Secondary | ICD-10-CM | POA: Diagnosis not present

## 2019-11-09 DIAGNOSIS — K029 Dental caries, unspecified: Secondary | ICD-10-CM

## 2019-11-09 MED ORDER — CHLORHEXIDINE GLUCONATE 0.12 % MT SOLN: 15.0000 mL | Freq: Two times a day (BID) | OROMUCOSAL | 0 refills | Status: AC

## 2019-11-09 MED ORDER — NAPROXEN 500 MG PO TABS: 500.0000 mg | ORAL_TABLET | Freq: Two times a day (BID) | ORAL | 0 refills | Status: AC

## 2019-11-09 MED ORDER — AMOXICILLIN-POT CLAVULANATE 875-125 MG PO TABS: 1.0000 | ORAL_TABLET | Freq: Two times a day (BID) | ORAL | 0 refills | Status: AC

## 2019-11-09 NOTE — ED Triage Notes (Signed)
Dental pain and swelling to RT side of face that started yesterday morning

## 2019-11-09 NOTE — ED Provider Notes (Signed)
[facility]   [id number] 11/09/19 Arrival Time: 1017  Chief Complaint  Patient presents with  . Oral Swelling     SUBJECTIVE:  Cameron Stone is a 59 y.o. male who presents with dental pain and swelling to right side of face that started this morning.  Denies a precipitating event or trauma.  Localizes pain to right upper gum and swelling to right face.  Has tried OTC analgesics without relief.  Worse with chewing.  Does not see a dentist regularly.  Denies similar symptoms in the past.  Denies fever, chills, dysphagia, odynophagia, oral or neck swelling, nausea, vomiting, chest pain, SOB.    ROS: As per HPI.  All other pertinent ROS negative.      Past Medical History:  Diagnosis Date  . CAD (coronary artery disease)    2 vessel  . RBBB (right bundle branch block with left anterior fascicular block)   . Schizophrenia [facility])    Past Surgical History:  Procedure Laterality Date  . CORONARY ARTERY BYPASS GRAFT  06/2009   Off-pump LIMA to LAD and a radial to OM  . Left pneumothorax     Secondary to trauma   No Known Allergies No current facility-administered medications on file prior to encounter.   Current Outpatient Medications on File Prior to Encounter  Medication Sig Dispense Refill  . aspirin 325 MG EC tablet Take 162 mg by mouth daily.      . metoprolol succinate (TOPROL-XL) 25 MG 24 hr tablet Take 12.5 mg by mouth daily.       Social History   Socioeconomic History  . Marital status: Single    Spouse name: Not on file  . Number of children: Not on file  . Years of education: Not on file  . Highest education level: Not on file  Occupational History  . Occupation: Self employed  Tobacco Use  . Smoking status: Current Every Day Smoker    Packs/day: 0.50  . Smokeless tobacco: Never Used  . Tobacco comment: states started smoking at age 28  Substance and Sexual Activity  . Alcohol use: Yes    Comment: 2-3 beers daily  . Drug use: No  . Sexual  activity: Not on file  Other Topics Concern  . Not on file  Social History Narrative  . Not on file   Social Determinants of Health   Financial Resource Strain:   . Difficulty of Paying Living Expenses: Not on file  Food Insecurity:   . Worried About Programme researcher, broadcasting/film/video in the Last Year: Not on file  . Ran Out of Food in the Last Year: Not on file  Transportation Needs:   . Lack of Transportation (Medical): Not on file  . Lack of Transportation (Non-Medical): Not on file  Physical Activity:   . Days of Exercise per Week: Not on file  . Minutes of Exercise per Session: Not on file  Stress:   . Feeling of Stress : Not on file  Social Connections:   . Frequency of Communication with Friends and Family: Not on file  . Frequency of Social Gatherings with Friends and Family: Not on file  . Attends Religious Services: Not on file  . Active Member of Clubs or Organizations: Not on file  . Attends Banker Meetings: Not on file  . Marital Status: Not on file  Intimate Partner Violence:   . Fear of Current or Ex-Partner: Not on file  . Emotionally Abused: Not on file  .  Physically Abused: Not on file  . Sexually Abused: Not on file   Family History  Problem Relation Age of Onset  . Cancer Mother   . Diabetes Mother   . Heart attack Father        Had MI and CABG age 13 & 91    OBJECTIVE:  Vitals:   11/09/19 1031 11/09/19 1033  BP:  118/75  Pulse:  82  Resp:  18  Temp:  98 F (36.7 C)  TempSrc:  Oral  SpO2:  97%  Weight: 200 lb (90.7 kg)   Height: 5\' 9"  (1.753 m)     Physical Exam Vitals and nursing note reviewed.  Constitutional:      General: He is not in acute distress.    Appearance: Normal appearance. He is normal weight. He is not ill-appearing, toxic-appearing or diaphoretic.  HENT:     Mouth/Throat:     Lips: Pink.     Mouth: Mucous membranes are moist.     Dentition: Abnormal dentition. Dental caries and dental abscesses present.     Pharynx:  Oropharynx is clear.     Tonsils: 0 on the right. 0 on the left.      Comments: Abnormal dentition, dental caries, dental abscess present Cardiovascular:     Rate and Rhythm: Normal rate and regular rhythm.     Pulses: Normal pulses.     Heart sounds: Normal heart sounds. No murmur heard.  No friction rub. No gallop.   Pulmonary:     Effort: Pulmonary effort is normal. No respiratory distress.     Breath sounds: Normal breath sounds. No stridor. No wheezing, rhonchi or rales.  Chest:     Chest wall: No tenderness.  Neurological:     Mental Status: He is alert and oriented to person, place, and time.      ASSESSMENT & PLAN:  1. Pain, dental   2. Dental caries   3. Right facial swelling   4. Dental abscess     Meds ordered this encounter  Medications  . chlorhexidine (PERIDEX) 0.12 % solution    Sig: Use as directed 15 mLs in the mouth or throat 2 (two) times daily.    Dispense:  120 mL    Refill:  0  . amoxicillin-clavulanate (AUGMENTIN) 875-125 MG tablet    Sig: Take 1 tablet by mouth every 12 (twelve) hours.    Dispense:  14 tablet    Refill:  0  . naproxen (NAPROSYN) 500 MG tablet    Sig: Take 1 tablet (500 mg total) by mouth 2 (two) times daily.    Dispense:  30 tablet    Refill:  0    Discharge instructions  Naproxen prescribed.  Use as directed for pain relief Augmentin was prescribed Chlorhexidine mouthwash was prescribed Recommend soft diet until evaluated by dentist Maintain oral hygiene care Follow up with dentist as soon as possible for further evaluation and treatment  Return or go to the ED if you have any new or worsening symptoms such as fever, chills, difficulty swallowing, painful swallowing, oral or neck swelling, nausea, vomiting, chest pain, SOB, etc...  Reviewed expectations re: course of current medical issues. Questions answered. Outlined signs and symptoms indicating need for more acute intervention. Patient verbalized  understanding. After Visit Summary given.   , FNP 11/09/19 1046

## 2019-11-09 NOTE — Discharge Instructions (Addendum)
Naproxen prescribed.  Use as directed for pain relief Augmentin was prescribed Chlorhexidine mouthwash was prescribed Recommend soft diet until evaluated by dentist Maintain oral hygiene care Follow up with dentist as soon as possible for further evaluation and treatment  Return or go to the ED if you have any new or worsening symptoms such as fever, chills, difficulty swallowing, painful swallowing, oral or neck swelling, nausea, vomiting, chest pain, SOB, etc..Marland Kitchen

## 2022-04-13 ENCOUNTER — Other Ambulatory Visit: Payer: Self-pay | Admitting: Gastroenterology

## 2022-04-13 DIAGNOSIS — K7460 Unspecified cirrhosis of liver: Secondary | ICD-10-CM

## 2022-04-13 DIAGNOSIS — B182 Chronic viral hepatitis C: Secondary | ICD-10-CM
